# Patient Record
Sex: Female | Born: 1955 | Race: Black or African American | Hispanic: No | Marital: Married | Smoking: Never smoker
Health system: Southern US, Community
[De-identification: ages and names within clinical notes are randomized; demographics above are authoritative.]

## PROBLEM LIST (undated history)

## (undated) DIAGNOSIS — C801 Malignant (primary) neoplasm, unspecified: Secondary | ICD-10-CM

## (undated) HISTORY — PX: MASTECTOMY: SHX3

---

## 2015-02-22 ENCOUNTER — Other Ambulatory Visit: Payer: Self-pay | Admitting: Family Medicine

## 2015-02-22 DIAGNOSIS — Z853 Personal history of malignant neoplasm of breast: Secondary | ICD-10-CM

## 2015-12-20 ENCOUNTER — Emergency Department (HOSPITAL_BASED_OUTPATIENT_CLINIC_OR_DEPARTMENT_OTHER): Payer: Worker's Compensation

## 2015-12-20 ENCOUNTER — Emergency Department (HOSPITAL_BASED_OUTPATIENT_CLINIC_OR_DEPARTMENT_OTHER)
Admission: EM | Admit: 2015-12-20 | Discharge: 2015-12-20 | Disposition: A | Discharge status: Home / Self Care | Payer: Worker's Compensation | Attending: Emergency Medicine | Admitting: Emergency Medicine

## 2015-12-20 ENCOUNTER — Encounter (HOSPITAL_BASED_OUTPATIENT_CLINIC_OR_DEPARTMENT_OTHER): Payer: Self-pay | Admitting: Emergency Medicine

## 2015-12-20 DIAGNOSIS — S99922A Unspecified injury of left foot, initial encounter: Secondary | ICD-10-CM | POA: Insufficient documentation

## 2015-12-20 DIAGNOSIS — Y9389 Activity, other specified: Secondary | ICD-10-CM | POA: Insufficient documentation

## 2015-12-20 DIAGNOSIS — W502XXA Accidental twist by another person, initial encounter: Secondary | ICD-10-CM | POA: Diagnosis not present

## 2015-12-20 DIAGNOSIS — Y999 Unspecified external cause status: Secondary | ICD-10-CM | POA: Diagnosis not present

## 2015-12-20 DIAGNOSIS — M79672 Pain in left foot: Secondary | ICD-10-CM | POA: Diagnosis present

## 2015-12-20 DIAGNOSIS — Y92219 Unspecified school as the place of occurrence of the external cause: Secondary | ICD-10-CM | POA: Insufficient documentation

## 2015-12-20 DIAGNOSIS — Z859 Personal history of malignant neoplasm, unspecified: Secondary | ICD-10-CM | POA: Insufficient documentation

## 2015-12-20 HISTORY — DX: Malignant (primary) neoplasm, unspecified: C80.1
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
## 2015-12-20 NOTE — Discharge Instructions (Signed)
Please use ice and ibuprofen as needed for discomfort. Please elevate, avoid high-intensity activities on the affected foot. Please follow-up with your primary care provider if symptoms continue to persist.

## 2015-12-20 NOTE — ED Notes (Signed)
CMS intact before and after. Pt tolerated well. Pt had no questions.  

## 2016-09-03 ENCOUNTER — Emergency Department (HOSPITAL_BASED_OUTPATIENT_CLINIC_OR_DEPARTMENT_OTHER)
Admission: EM | Admit: 2016-09-03 | Discharge: 2016-09-03 | Disposition: A | Discharge status: Home / Self Care | Payer: Worker's Compensation | Attending: Emergency Medicine | Admitting: Emergency Medicine

## 2016-09-03 ENCOUNTER — Emergency Department (HOSPITAL_BASED_OUTPATIENT_CLINIC_OR_DEPARTMENT_OTHER): Payer: Worker's Compensation

## 2016-09-03 ENCOUNTER — Encounter (HOSPITAL_BASED_OUTPATIENT_CLINIC_OR_DEPARTMENT_OTHER): Payer: Self-pay | Admitting: *Deleted

## 2016-09-03 DIAGNOSIS — S0990XA Unspecified injury of head, initial encounter: Secondary | ICD-10-CM | POA: Insufficient documentation

## 2016-09-03 DIAGNOSIS — Y939 Activity, unspecified: Secondary | ICD-10-CM | POA: Diagnosis not present

## 2016-09-03 DIAGNOSIS — M25521 Pain in right elbow: Secondary | ICD-10-CM | POA: Diagnosis not present

## 2016-09-03 DIAGNOSIS — M79631 Pain in right forearm: Secondary | ICD-10-CM | POA: Diagnosis not present

## 2016-09-03 DIAGNOSIS — X58XXXA Exposure to other specified factors, initial encounter: Secondary | ICD-10-CM | POA: Insufficient documentation

## 2016-09-03 DIAGNOSIS — Y929 Unspecified place or not applicable: Secondary | ICD-10-CM | POA: Diagnosis not present

## 2016-09-03 DIAGNOSIS — R531 Weakness: Secondary | ICD-10-CM | POA: Diagnosis not present

## 2016-09-03 DIAGNOSIS — R0789 Other chest pain: Secondary | ICD-10-CM | POA: Insufficient documentation

## 2016-09-03 DIAGNOSIS — Y999 Unspecified external cause status: Secondary | ICD-10-CM | POA: Diagnosis not present

## 2016-09-03 DIAGNOSIS — S59911A Unspecified injury of right forearm, initial encounter: Secondary | ICD-10-CM | POA: Diagnosis present

## 2016-09-03 DIAGNOSIS — S4991XA Unspecified injury of right shoulder and upper arm, initial encounter: Secondary | ICD-10-CM

## 2016-09-03 DIAGNOSIS — M79641 Pain in right hand: Secondary | ICD-10-CM
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
## 2016-09-03 NOTE — Discharge Instructions (Signed)
I suspect that you may have a tendon or nerve injury secondary to having it over stretched.  Your CT scans showed no significant abnormalities. Please follow up with the sports medicine doctor as soon as possible.

## 2016-09-07 ENCOUNTER — Ambulatory Visit: Payer: Self-pay | Admitting: Family Medicine

## 2017-10-03 ENCOUNTER — Emergency Department (HOSPITAL_BASED_OUTPATIENT_CLINIC_OR_DEPARTMENT_OTHER): Admission: EM | Admit: 2017-10-03 | Discharge: 2017-10-03 | Discharge status: Absent without leave | Payer: Self-pay
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
## 2022-06-09 ENCOUNTER — Emergency Department (HOSPITAL_BASED_OUTPATIENT_CLINIC_OR_DEPARTMENT_OTHER)
Admission: EM | Admit: 2022-06-09 | Discharge: 2022-06-09 | Disposition: A | Discharge status: Home / Self Care | Payer: BC Managed Care – PPO | Attending: Emergency Medicine | Admitting: Emergency Medicine

## 2022-06-09 ENCOUNTER — Emergency Department (HOSPITAL_BASED_OUTPATIENT_CLINIC_OR_DEPARTMENT_OTHER): Payer: BC Managed Care – PPO

## 2022-06-09 ENCOUNTER — Other Ambulatory Visit: Payer: Self-pay

## 2022-06-09 ENCOUNTER — Encounter (HOSPITAL_BASED_OUTPATIENT_CLINIC_OR_DEPARTMENT_OTHER): Payer: Self-pay

## 2022-06-09 DIAGNOSIS — M549 Dorsalgia, unspecified: Secondary | ICD-10-CM | POA: Insufficient documentation

## 2022-06-09 DIAGNOSIS — R109 Unspecified abdominal pain: Secondary | ICD-10-CM

## 2022-06-09 DIAGNOSIS — N200 Calculus of kidney: Secondary | ICD-10-CM

## 2022-06-09 DIAGNOSIS — R1011 Right upper quadrant pain: Secondary | ICD-10-CM | POA: Insufficient documentation

## 2022-06-09 LAB — CBC WITH DIFFERENTIAL/PLATELET
Abs Immature Granulocytes: 0.01 10*3/uL (ref 0.00–0.07)
Basophils Absolute: 0 10*3/uL (ref 0.0–0.1)
Basophils Relative: 1 %
Eosinophils Absolute: 0.1 10*3/uL (ref 0.0–0.5)
Eosinophils Relative: 2 %
HCT: 37.5 % (ref 36.0–46.0)
Hemoglobin: 12.5 g/dL (ref 12.0–15.0)
Immature Granulocytes: 0 %
Lymphocytes Relative: 57 %
Lymphs Abs: 2.2 10*3/uL (ref 0.7–4.0)
MCH: 31.3 pg (ref 26.0–34.0)
MCHC: 33.3 g/dL (ref 30.0–36.0)
MCV: 94 fL (ref 80.0–100.0)
Monocytes Absolute: 0.4 10*3/uL (ref 0.1–1.0)
Monocytes Relative: 11 %
Neutro Abs: 1.1 10*3/uL — ABNORMAL LOW (ref 1.7–7.7)
Neutrophils Relative %: 29 %
Platelets: 248 10*3/uL (ref 150–400)
RBC: 3.99 MIL/uL (ref 3.87–5.11)
RDW: 12.8 % (ref 11.5–15.5)
WBC: 3.9 10*3/uL — ABNORMAL LOW (ref 4.0–10.5)
nRBC: 0 % (ref 0.0–0.2)

## 2022-06-09 LAB — COMPREHENSIVE METABOLIC PANEL
ALT: 16 U/L (ref 0–44)
AST: 17 U/L (ref 15–41)
Albumin: 4.3 g/dL (ref 3.5–5.0)
Alkaline Phosphatase: 58 U/L (ref 38–126)
Anion gap: 9 (ref 5–15)
BUN: 9 mg/dL (ref 8–23)
CO2: 26 mmol/L (ref 22–32)
Calcium: 9.4 mg/dL (ref 8.9–10.3)
Chloride: 104 mmol/L (ref 98–111)
Creatinine, Ser: 0.83 mg/dL (ref 0.44–1.00)
GFR, Estimated: >60 mL/min (ref >60)
Glucose, Bld: 100 mg/dL — ABNORMAL HIGH (ref 70–99)
Potassium: 3.7 mmol/L (ref 3.5–5.1)
Sodium: 139 mmol/L (ref 135–145)
Total Bilirubin: 0.6 mg/dL (ref 0.3–1.2)
Total Protein: 7.5 g/dL (ref 6.5–8.1)

## 2022-06-09 LAB — URINALYSIS, ROUTINE W REFLEX MICROSCOPIC
Bilirubin Urine: NEGATIVE
Glucose, UA: NEGATIVE mg/dL
Hgb urine dipstick: NEGATIVE
Ketones, ur: NEGATIVE mg/dL
Leukocytes,Ua: NEGATIVE
Nitrite: NEGATIVE
Protein, ur: NEGATIVE mg/dL
Specific Gravity, Urine: 1.025 (ref 1.005–1.030)
pH: 5.5 (ref 5.0–8.0)

## 2022-06-09 LAB — LIPASE, BLOOD: Lipase: 41 U/L (ref 11–51)

## 2022-06-09 MED ORDER — IOHEXOL 300 MG/ML  SOLN
100.0000 mL | Freq: Once | INTRAMUSCULAR | Status: AC | PRN
  Administered 2022-06-09: 100 mL via INTRAVENOUS

## 2022-06-09 NOTE — ED Triage Notes (Signed)
Pt is complaining of rt sided flank pain that started a week and a half ago. Has gotten worse over the last few days. Denies any nausea, vomiting, or fever.  Does have bloating and no appetite.

## 2022-06-09 NOTE — ED Provider Notes (Signed)
  Physical Exam  BP (!) 186/95   Pulse (!) 57   Temp 97.6 F (36.4 C) (Oral)   Resp 16   Ht '5\' 6"'$  (1.676 m)   Wt 70.3 kg   SpO2 100%   BMI 25.02 kg/m   Physical Exam  Procedures  Procedures  ED Course / MDM   Clinical Course as of 06/09/22 1922  Sat Jun 09, 2022  0710 Assumed care from Dr Dayna Barker.  66 year old female who presents to the emergency department with 10 days of RUQ abdominal pain and back pain. Has distention and decreased bm. No hematuria on UA. Labs are unremarkable.  [RP]  [phone] Patient reassessed.  Repeat abdominal exam benign.  Negative Murphy sign.  Informed of her nonobstructive kidney stones that were seen on her CT scan which may be contributing to her pain.  Did not see any evidence of cholecystitis on the CT scan and with her benign repeat exam feel this is highly unlikely.  Patient was counseled to follow-up with her primary doctor and offered oxycodone for breakthrough pain which patient declined.  Also given urology referral should her pain persist. [RP]    Clinical Course User Index [RP] Fransico Meadow, MD   Medical Decision Making Amount and/or Complexity of Data Reviewed Labs: ordered. Radiology: ordered.  Risk Prescription drug management.     Fransico Meadow, MD 06/09/22 [place]

## 2022-06-09 NOTE — Discharge Instructions (Signed)
You were seen for side pain.  You had a CT scan that showed nonobstructing kidney stones on the right side.   At home, please take ibuprofen, Tylenol, and/or oxycodone for breakthrough pain.    Follow-up with your primary doctor in 2-3 days regarding your visit.  If your symptoms persist for several weeks please follow-up with urology.  Return immediately to the emergency department if you experience any of the following: Fevers, severe flank pain, or any other concerning symptoms.    Thank you for visiting our Emergency Department. It was a pleasure taking care of you today.

## 2022-06-09 NOTE — ED Provider Notes (Signed)
[facility] Provider Note   CSN: [id number] Arrival date & time: 06/09/22  0419     History  Chief Complaint  Patient presents with   Abdominal Pain    Holly Marquez is a 66 y.o. female.  66 year old female without significant past medical history who presents the ER today secondary to right upper quadrant pain and right-sided back pain.  Patient states been going on for about 10 to 12 days and she saw her doctor few days ago.  Give her prescription for hydrocodone which she does not really like much.  States that the pain got worse and her doctor said if it got worse to come to the ER.  She did have an episode of vomiting but not persistent.  No fevers.  She has been constipated.  No diarrhea.  Does not drink alcohol, smoke cigarettes or do any illicit drugs.  No over-the-counter medications.   Abdominal Pain      Home Medications Prior to Admission medications   Not on File      Allergies    Sulfa antibiotics    Review of Systems   Review of Systems  Gastrointestinal:  Positive for abdominal pain.    Physical Exam Updated Vital Signs BP (!) 173/93   Pulse 60   Temp 97.6 F (36.4 C) (Oral)   Resp 18   Ht '5\' 6"'$  (1.676 m)   Wt 70.3 kg   SpO2 100%   BMI 25.02 kg/m  Physical Exam Vitals and nursing note reviewed.  Constitutional:      Appearance: She is well-developed.  HENT:     Head: Normocephalic and atraumatic.  Cardiovascular:     Rate and Rhythm: Normal rate and regular rhythm.  Pulmonary:     Effort: No respiratory distress.     Breath sounds: No stridor.  Abdominal:     General: There is no distension.     Tenderness: There is abdominal tenderness in the right upper quadrant and epigastric area. There is right CVA tenderness.  Musculoskeletal:     Cervical back: Normal range of motion.  Neurological:     Mental Status: She is alert.     ED Results / Procedures / Treatments   Labs (all labs ordered are listed,  but only abnormal results are displayed) Labs Reviewed  CBC WITH DIFFERENTIAL/PLATELET - Abnormal; Notable for the following components:      Result Value   WBC 3.9 (*)    Neutro Abs 1.1 (*)    All other components within normal limits  COMPREHENSIVE METABOLIC PANEL - Abnormal; Notable for the following components:   Glucose, Bld 100 (*)    All other components within normal limits  LIPASE, BLOOD  URINALYSIS, ROUTINE W REFLEX MICROSCOPIC    EKG None  Radiology No results found.  Procedures Procedures    Medications Ordered in ED Medications  iohexol (OMNIPAQUE) 300 MG/ML solution 100 mL (100 mLs Intravenous Contrast Given 06/09/22 0615)    ED Course/ Medical Decision Making/ A&P                          Medical Decision Making Amount and/or Complexity of Data Reviewed Labs: ordered. Radiology: ordered.  Risk Prescription drug management.   Concern for possible cholecystitis versus less likely nephrolithiasis.  Less likelihood of appendicitis but will get CT scan and labs to evaluate for same.  Patient deferring pain medication at this time.  On my review the  CT scan does appear that she has a thickened gallbladder wall but also any gallstones or.  Cholecystic fluid.  She has moderate stool and also some gaseous distention particularly on the right side.  This could be the cause for her symptoms but pending radiology read for ultimate disposition.  Care transferred pending radiology read.   Final Clinical Impression(s) / ED Diagnoses Final diagnoses:  None    Rx / DC Orders ED Discharge Orders     None         Novah Nessel, Corene Cornea, MD 06/09/22 2319
